# Patient Record
Sex: Male | Born: 1980 | Race: White | Hispanic: Yes | Marital: Single | Smoking: Never smoker
Health system: Southern US, Community
[De-identification: ages and names within clinical notes are randomized; demographics above are authoritative.]

## PROBLEM LIST (undated history)

## (undated) HISTORY — PX: APPENDECTOMY: SHX54

## (undated) HISTORY — PX: BACK SURGERY: SHX140

---

## 2020-10-12 ENCOUNTER — Ambulatory Visit
Admission: EM | Admit: 2020-10-12 | Discharge: 2020-10-12 | Disposition: A | Discharge status: Home / Self Care | Payer: HRSA Program | Attending: Family Medicine | Admitting: Family Medicine

## 2020-10-12 ENCOUNTER — Other Ambulatory Visit: Payer: Self-pay

## 2020-10-12 DIAGNOSIS — U071 COVID-19: Secondary | ICD-10-CM | POA: Diagnosis not present

## 2020-10-12 MED ORDER — KETOROLAC TROMETHAMINE 10 MG PO TABS: 10.0000 mg | ORAL_TABLET | Freq: Four times a day (QID) | ORAL | 0 refills | Status: AC | PRN

## 2020-10-12 MED ORDER — BENZONATATE 200 MG PO CAPS: 200.0000 mg | ORAL_CAPSULE | Freq: Three times a day (TID) | ORAL | 0 refills | Status: AC | PRN

## 2020-10-12 NOTE — Discharge Instructions (Signed)
Test result will be back tomorrow.  Stay home.   Medication as prescribed.  Take care  Dr. Adriana Simas

## 2020-10-12 NOTE — ED Triage Notes (Signed)
Patient complains of headache, cough and body aches, diarrhea and chills since yesterday. Patient has been exposed to covid.

## 2020-10-13 LAB — SARS CORONAVIRUS 2 (TAT 6-24 HRS): SARS Coronavirus 2: POSITIVE — AB

## 2020-10-13 NOTE — ED Provider Notes (Signed)
[facility]    CSN: [id number] Arrival date & time: 10/12/20  1240      History   Chief Complaint Chief Complaint  Patient presents with  . Headache    HPI Furqan Gosselin is a 40 y.o. male.   HPI  40 year old male presents with headache, cough, diarrhea, chills.  Symptoms started yesterday. Has had recent COVID exposure. Pain is diffuse and 8/10 in severity. No relieving factors. No current fever. No other complaints at this time.   Home Medications    Prior to Admission medications   Medication Sig Start Date End Date Taking? Authorizing Provider  benzonatate (TESSALON) 200 MG capsule Take 1 capsule (200 mg total) by mouth 3 (three) times daily as needed for cough. 10/12/20  Yes Jadine Brumley G, DO  ketorolac (TORADOL) 10 MG tablet Take 1 tablet (10 mg total) by mouth every 6 (six) hours as needed for moderate pain or severe pain. 10/12/20  Yes Tommie Sams, DO    Family History Family History  Problem Relation Age of Onset  . Healthy Mother   . Healthy Father     Social History Social History   Tobacco Use  . Smoking status: Never Smoker  . Smokeless tobacco: Never Used  Vaping Use  . Vaping Use: Never used  Substance Use Topics  . Alcohol use: Yes    Comment: occasionally  . Drug use: Never     Allergies   Patient has no known allergies.   Review of Systems Review of Systems Per HPI  Physical Exam Triage Vital Signs ED Triage Vitals  Enc Vitals Group     BP 10/12/20 1641 (!) 138/92     Pulse Rate 10/12/20 1641 82     Resp 10/12/20 1641 18     Temp 10/12/20 1641 98.2 F (36.8 C)     Temp Source 10/12/20 1641 Oral     SpO2 10/12/20 1641 100 %     Weight 10/12/20 1637 180 lb (81.6 kg)     Height --      Head Circumference --      Peak Flow --      Pain Score 10/12/20 1637 8     Pain Loc --      Pain Edu? --      Excl. in GC? --    Updated Vital Signs BP (!) 138/92 (BP Location: Left Arm)   Pulse 82   Temp 98.2 F (36.8 C)  (Oral)   Resp 18   Wt 81.6 kg   SpO2 100%   Visual Acuity Right Eye Distance:   Left Eye Distance:   Bilateral Distance:    Right Eye Near:   Left Eye Near:    Bilateral Near:     Physical Exam Vitals and nursing note reviewed.  Constitutional:      General: He is not in acute distress.    Appearance: He is well-developed. He is not ill-appearing.  HENT:     Head: Normocephalic and atraumatic.  Cardiovascular:     Rate and Rhythm: Normal rate and regular rhythm.  Pulmonary:     Effort: Pulmonary effort is normal.     Breath sounds: Normal breath sounds. No wheezing, rhonchi or rales.  Neurological:     Mental Status: He is alert.  Psychiatric:        Mood and Affect: Mood normal.        Behavior: Behavior normal.     UC Treatments /  Results  Labs (all labs ordered are listed, but only abnormal results are displayed) Labs Reviewed  SARS CORONAVIRUS 2 (TAT 6-24 HRS) - Abnormal; Notable for the following components:      Result Value   SARS Coronavirus 2 POSITIVE (*)    All other components within normal limits    EKG   Radiology No results found.  Procedures Procedures (including critical care time)  Medications Ordered in UC Medications - No data to display  Initial Impression / Assessment and Plan / UC Course  I have reviewed the triage vital signs and the nursing notes.  Pertinent labs & imaging results that were available during my care of the patient were reviewed by me and considered in my medical decision making (see chart for details).    40 year old male presents with COVID. Treating with toradol and tessalon perles.   Final Clinical Impressions(s) / UC Diagnoses   Final diagnoses:  COVID     Discharge Instructions     Test result will be back tomorrow.  Stay home.   Medication as prescribed.  Take care  Dr. Adriana Simas    ED Prescriptions    Medication Sig Dispense Auth. Provider   ketorolac (TORADOL) 10 MG tablet Take 1 tablet  (10 mg total) by mouth every 6 (six) hours as needed for moderate pain or severe pain. 20 tablet Jamir Rone G, DO   benzonatate (TESSALON) 200 MG capsule Take 1 capsule (200 mg total) by mouth 3 (three) times daily as needed for cough. 30 capsule Tommie Sams, DO     PDMP not reviewed this encounter.   Tommie Sams, [place] 10/13/20 2313

## 2022-03-05 ENCOUNTER — Emergency Department: Payer: Self-pay

## 2022-03-05 ENCOUNTER — Other Ambulatory Visit: Payer: Self-pay

## 2022-03-05 ENCOUNTER — Encounter: Payer: Self-pay | Admitting: Radiology

## 2022-03-05 ENCOUNTER — Emergency Department
Admission: EM | Admit: 2022-03-05 | Discharge: 2022-03-05 | Disposition: A | Discharge status: Home / Self Care | Payer: Self-pay | Attending: Emergency Medicine | Admitting: Emergency Medicine

## 2022-03-05 DIAGNOSIS — I10 Essential (primary) hypertension: Secondary | ICD-10-CM | POA: Insufficient documentation

## 2022-03-05 DIAGNOSIS — M6281 Muscle weakness (generalized): Secondary | ICD-10-CM | POA: Insufficient documentation

## 2022-03-05 DIAGNOSIS — R200 Anesthesia of skin: Secondary | ICD-10-CM | POA: Insufficient documentation

## 2022-03-05 LAB — CBC WITH DIFFERENTIAL/PLATELET
Abs Immature Granulocytes: 0.01 10*3/uL (ref 0.00–0.07)
Basophils Absolute: 0 10*3/uL (ref 0.0–0.1)
Basophils Relative: 1 %
Eosinophils Absolute: 0.1 10*3/uL (ref 0.0–0.5)
Eosinophils Relative: 2 %
HCT: 47.8 % (ref 39.0–52.0)
Hemoglobin: 16.4 g/dL (ref 13.0–17.0)
Immature Granulocytes: 0 %
Lymphocytes Relative: 31 %
Lymphs Abs: 2.2 10*3/uL (ref 0.7–4.0)
MCH: 30.1 pg (ref 26.0–34.0)
MCHC: 34.3 g/dL (ref 30.0–36.0)
MCV: 87.7 fL (ref 80.0–100.0)
Monocytes Absolute: 0.3 10*3/uL (ref 0.1–1.0)
Monocytes Relative: 5 %
Neutro Abs: 4.5 10*3/uL (ref 1.7–7.7)
Neutrophils Relative %: 61 %
Platelets: 160 10*3/uL (ref 150–400)
RBC: 5.45 MIL/uL (ref 4.22–5.81)
RDW: 11.9 % (ref 11.5–15.5)
WBC: 7.2 10*3/uL (ref 4.0–10.5)
nRBC: 0 % (ref 0.0–0.2)

## 2022-03-05 LAB — COMPREHENSIVE METABOLIC PANEL
ALT: 29 U/L (ref 0–44)
AST: 30 U/L (ref 15–41)
Albumin: 5.4 g/dL — ABNORMAL HIGH (ref 3.5–5.0)
Alkaline Phosphatase: 81 U/L (ref 38–126)
Anion gap: 12 (ref 5–15)
BUN: 10 mg/dL (ref 6–20)
CO2: 24 mmol/L (ref 22–32)
Calcium: 10.1 mg/dL (ref 8.9–10.3)
Chloride: 106 mmol/L (ref 98–111)
Creatinine, Ser: 0.95 mg/dL (ref 0.61–1.24)
GFR, Estimated: >60 mL/min (ref >60)
Glucose, Bld: 97 mg/dL (ref 70–99)
Potassium: 3.3 mmol/L — ABNORMAL LOW (ref 3.5–5.1)
Sodium: 142 mmol/L (ref 135–145)
Total Bilirubin: 1.1 mg/dL (ref 0.3–1.2)
Total Protein: 8.2 g/dL — ABNORMAL HIGH (ref 6.5–8.1)

## 2022-03-05 LAB — URINALYSIS, ROUTINE W REFLEX MICROSCOPIC
Bilirubin Urine: NEGATIVE
Glucose, UA: NEGATIVE mg/dL
Hgb urine dipstick: NEGATIVE
Ketones, ur: NEGATIVE mg/dL
Leukocytes,Ua: NEGATIVE
Nitrite: NEGATIVE
Protein, ur: NEGATIVE mg/dL
Specific Gravity, Urine: 1.006 (ref 1.005–1.030)
pH: 7 (ref 5.0–8.0)

## 2022-03-05 LAB — PROTIME-INR
INR: 1 (ref 0.8–1.2)
Prothrombin Time: 13.2 seconds (ref 11.4–15.2)

## 2022-03-05 IMAGING — MR MR HEAD WO/W CM
14 series · 48 of 48 positions shown · IV contrast (gadavist)
Comparison: No prior MRI of the head or cervical spine, correlation
is made with CT head [DATE]

CLINICAL DATA: Left-sided numbness, stroke or cervical
radiculopathy suspected

EXAM:
MRI HEAD WITHOUT AND WITH CONTRAST
MRI CERVICAL SPINE WITHOUT AND WITH CONTRAST
TECHNIQUE: Multiplanar, multiecho pulse sequences of the brain and surrounding
structures, and cervical spine, to include the craniocervical
junction and cervicothoracic junction, were obtained without and
with intravenous contrast.
CONTRAST:  8mL GADAVIST GADOBUTROL 1 MMOL/ML IV SOLN

[Series 5: ax dwi_tracew · axial · 3.0mm · 0.65mm/px · z∈[-112,+43]mm · 3 of 48 slices shown]
[im 1/48]
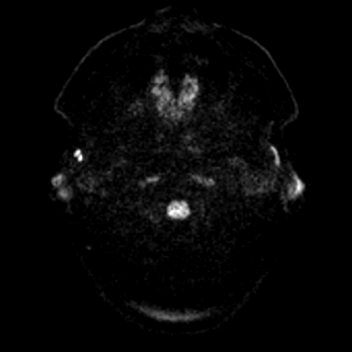
[im 24/48]
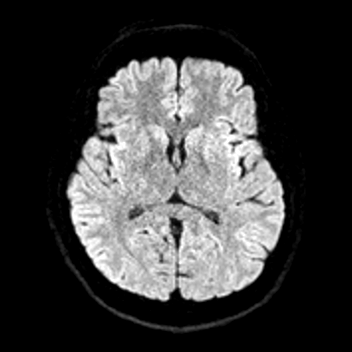
[im 48/48]
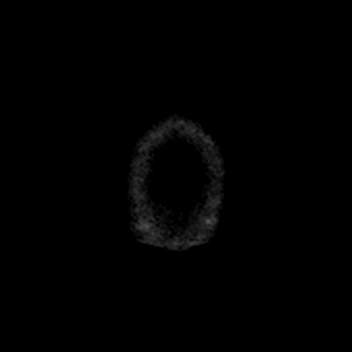

[Series 6: ax dwi_adc · axial · 3.0mm · 0.65mm/px · z∈[-112,+43]mm · 3 of 48 slices shown]
[im 1/48]
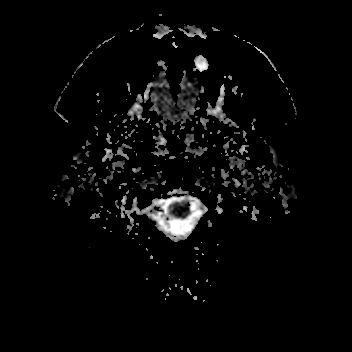
[im 24/48]
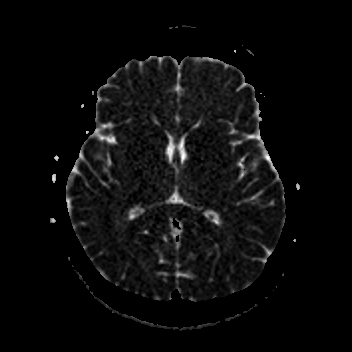
[im 48/48]
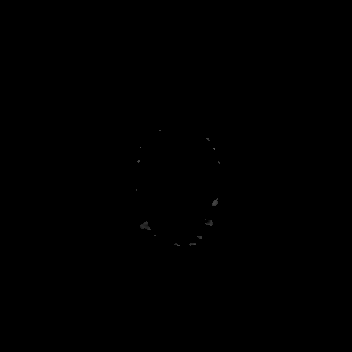

[Series 7: cor dwi_tracew · coronal · 5.0mm · 0.60mm/px · 2 of 34 slices shown]
[im 1/34]
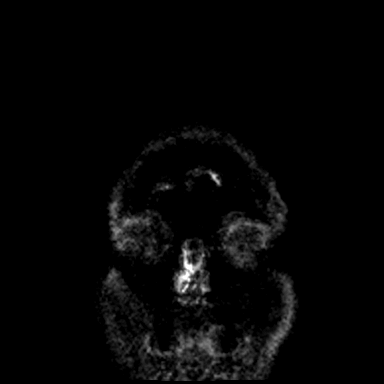
[im 34/34]
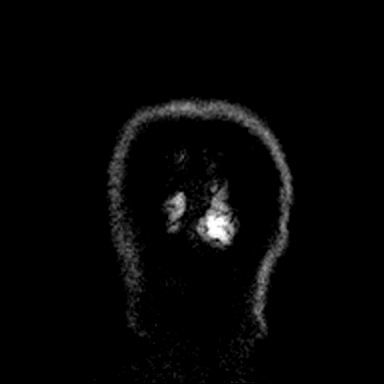

[Series 8: cor dwi_adc · coronal · 5.0mm · 0.60mm/px · 2 of 34 slices shown]
[im 1/34]
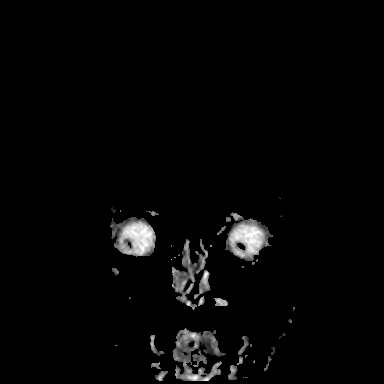
[im 34/34]
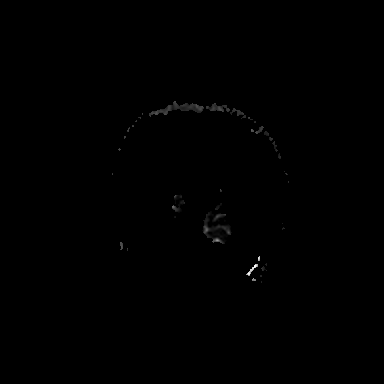

[Series 9: T1 · sagittal · 5.0mm · 0.62mm/px · 1 of 22 slices shown (1 of 2)]
[im 1/22]
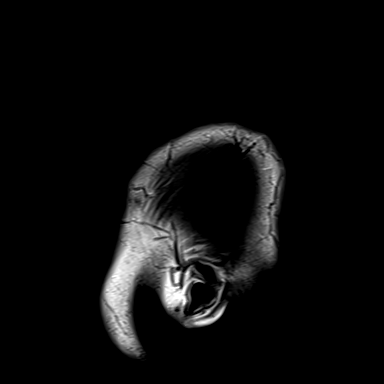

[Series 10: T2 · axial · 5.0mm · 0.53mm/px · 1 of 26 slices shown]
[im 1/26]
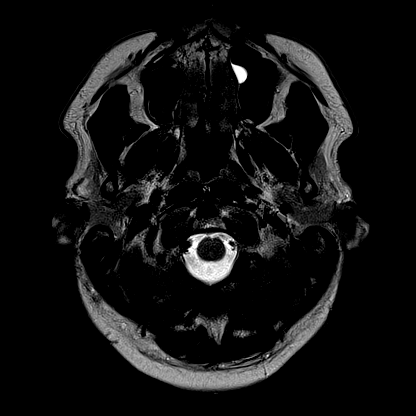

[Series 11: ax swi_mag · axial · 2.0mm · 0.90mm/px · z∈[-113,+44]mm · 5 of 80 slices shown]
[im 1/80]
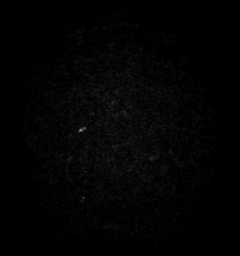
[im 20/80]
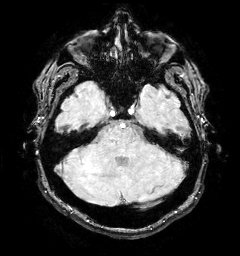
[im 40/80]
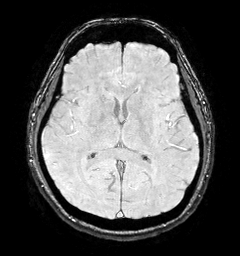
[im 60/80]
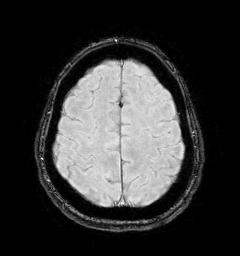
[im 80/80]
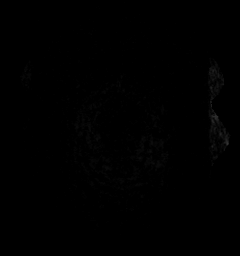

[Series 12: ax swi_pha · axial · 2.0mm · 0.90mm/px · z∈[-113,+44]mm · 5 of 80 slices shown]
[im 1/80]
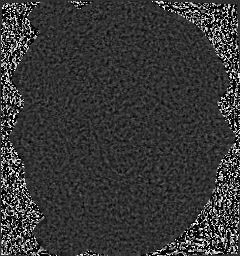
[im 20/80]
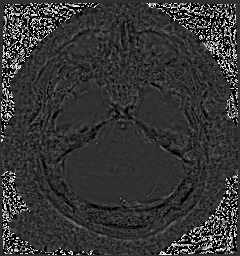
[im 40/80]
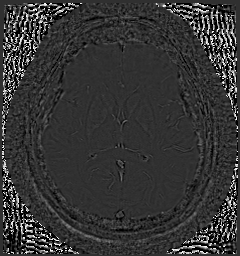
[im 60/80]
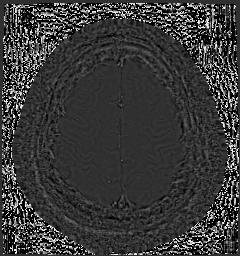
[im 80/80]
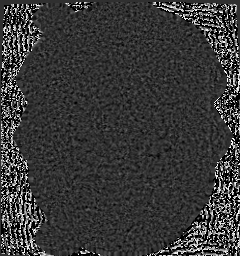

[Series 13: ax swi_swi · axial · 2.0mm · 0.90mm/px · z∈[-113,+44]mm · 5 of 80 slices shown]
[im 1/80]
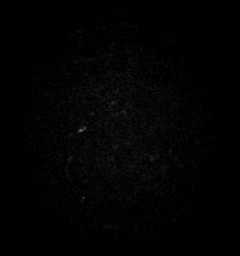
[im 20/80]
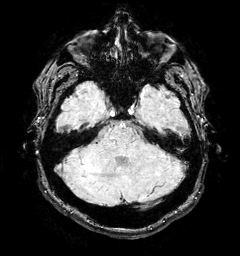
[im 40/80]
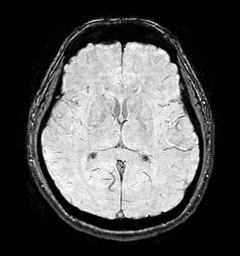
[im 60/80]
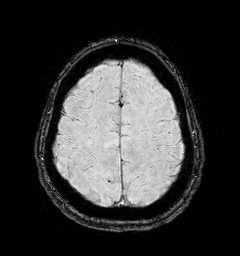
[im 80/80]
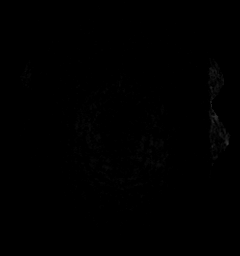

[Series 15: FLAIR · axial · 3.0mm · 0.53mm/px · z∈[-104,+36]mm · 3 of 48 slices shown]
[im 1/48]
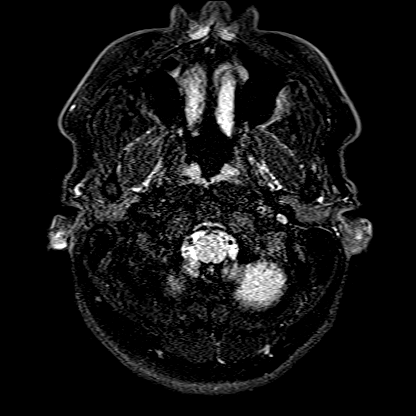
[im 24/48]
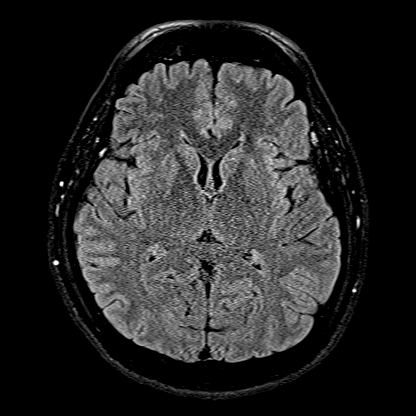
[im 48/48]
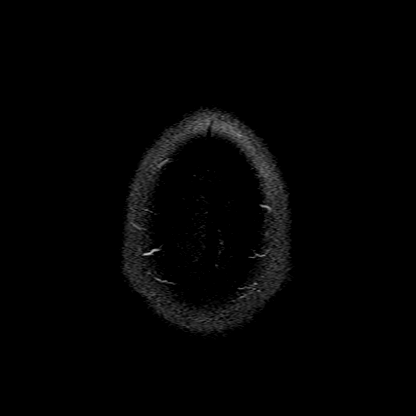

[Series 16: T1 · axial · 1.0mm · 0.98mm/px · z∈[-106,+36]mm · 8 of 144 slices shown (2 of 2)]
[im 1/144]
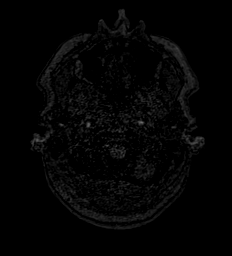
[im 21/144]
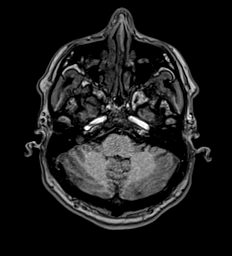
[im 41/144]
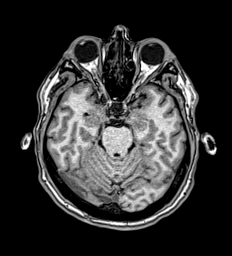
[im 62/144]
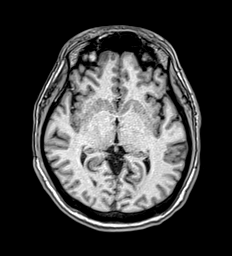
[im 82/144]
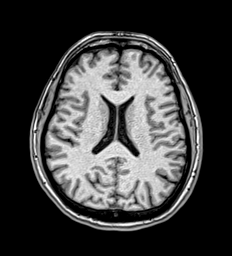
[im 103/144]
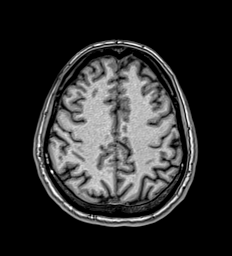
[im 123/144]
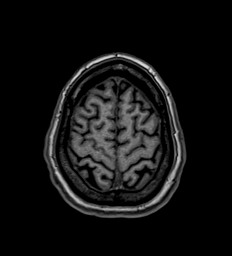
[im 144/144]
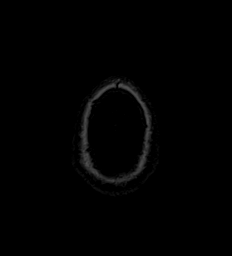

[Series 17: T2 post-contrast · coronal · 5.0mm · 0.57mm/px · 1 of 26 slices shown]
[im 1/26]
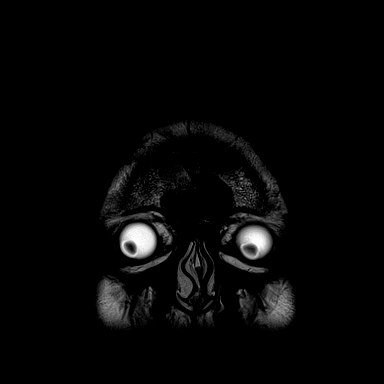

[Series 18: T1 post-contrast · axial · 1.0mm · 0.98mm/px · z∈[-106,+36]mm · 8 of 144 slices shown (1 of 2)]
[im 1/144]
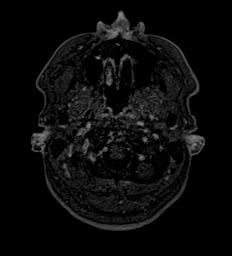
[im 21/144]
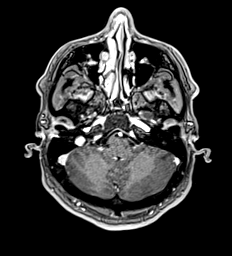
[im 41/144]
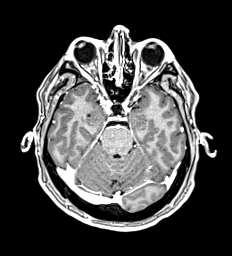
[im 62/144]
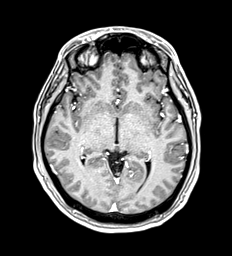
[im 82/144]
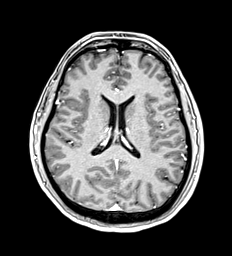
[im 103/144]
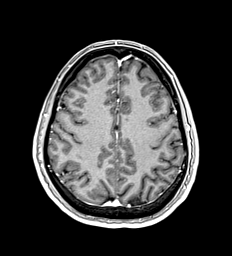
[im 123/144]
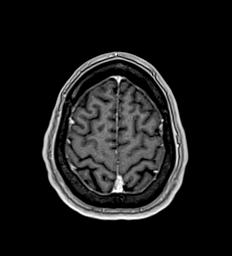
[im 144/144]
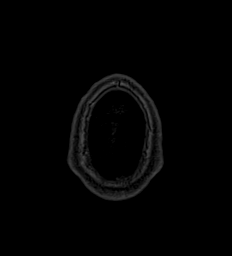

[Series 19: T1 post-contrast · coronal · 5.0mm · 0.57mm/px · 1 of 26 slices shown (2 of 2)]
[im 1/26]
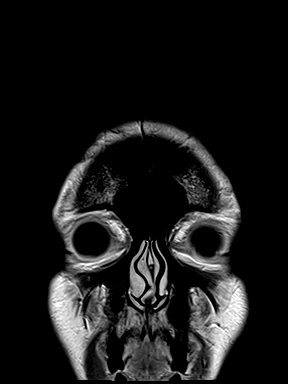

[48 of 48 positions shown; findings below may reference images not displayed]

FINDINGS: MRI HEAD FINDINGS

Brain: No restricted diffusion to suggest acute or subacute infarct.
No acute hemorrhage, mass, mass effect, or midline shift. No
hemosiderin deposition to suggest remote hemorrhage. No abnormal T2
hyperintense signal in the periventricular white matter. No abnormal
parenchymal or meningeal enhancement. No hydrocephalus or
extra-axial collection.

Vascular: Normal arterial flow voids. Normal vascular enhancement.
The venous sinuses are patent on postcontrast imaging.

Skull and upper cervical spine: Normal marrow signal.

Sinuses/Orbits: Mucous retention cyst in the left maxillary sinus.
The orbits are unremarkable.

Other: The mastoids are well aerated.

MRI CERVICAL SPINE FINDINGS

Alignment: Physiologic.

Vertebrae: No acute fracture or suspicious osseous lesion. No
abnormal osseous enhancement.

Cord: Normal in signal and morphology.  No abnormal enhancement.

Posterior Fossa, vertebral arteries, paraspinal tissues: Negative.

Disc levels:

C2-C3: No significant disc bulge. No spinal canal stenosis or neural
foraminal narrowing.

C3-C4: No significant disc bulge. No spinal canal stenosis or neural
foraminal narrowing.

C4-C5: No significant disc bulge. No spinal canal stenosis or neural
foraminal narrowing.

C5-C6: Minimal disc bulge. No spinal canal stenosis or neural
foraminal narrowing.

C6-C7: No significant disc bulge. No spinal canal stenosis or
neuroforaminal narrowing.

C7-T1: No significant disc bulge. No spinal canal stenosis or
neuroforaminal narrowing.
IMPRESSION: 1. No acute intracranial process. No evidence of acute or subacute
infarct. No abnormal enhancement.
2. No spinal canal stenosis or neural foraminal narrowing in the
cervical spine. Normal spinal cord signal. No abnormal enhancement.

## 2022-03-05 IMAGING — MR MR CERVICAL SPINE WO/W CM
5 of 8 series · 28 of 48 positions shown · IV contrast (8ml Gadavist)
Comparison: No prior MRI of the head or cervical spine, correlation
is made with CT head [DATE]

CLINICAL DATA: Left-sided numbness, stroke or cervical
radiculopathy suspected

EXAM:
MRI HEAD WITHOUT AND WITH CONTRAST
MRI CERVICAL SPINE WITHOUT AND WITH CONTRAST
TECHNIQUE: Multiplanar, multiecho pulse sequences of the brain and surrounding
structures, and cervical spine, to include the craniocervical
junction and cervicothoracic junction, were obtained without and
with intravenous contrast.
CONTRAST:  8mL GADAVIST GADOBUTROL 1 MMOL/ML IV SOLN

[Series 5: T2 · sagittal · 3.0mm · 0.62mm/px · 4 of 14 slices shown (1 of 2)]
[im 1/14]
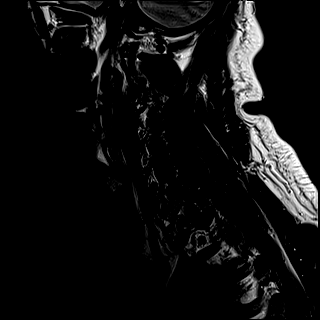
[im 5/14]
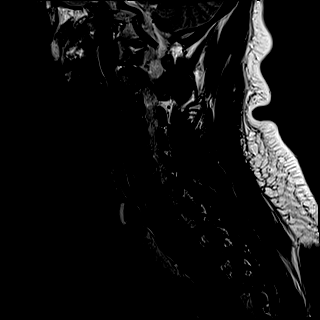
[im 9/14]
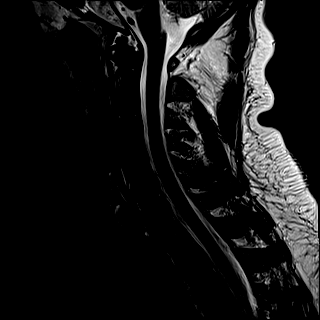
[im 14/14]
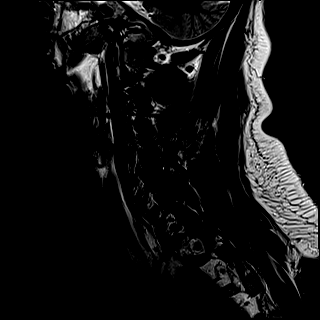

[Series 7: STIR · sagittal · 3.0mm · 0.62mm/px · 4 of 14 slices shown]
[im 1/14]
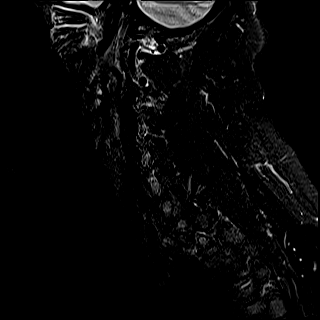
[im 5/14]
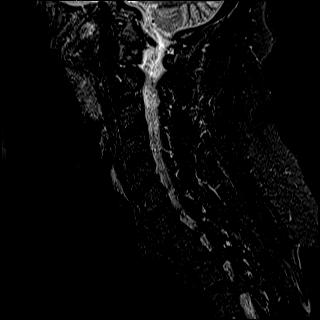
[im 9/14]
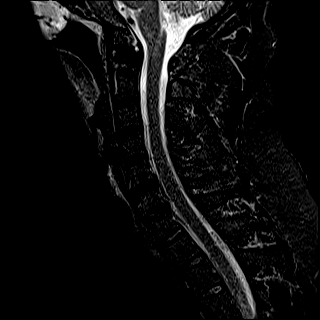
[im 14/14]
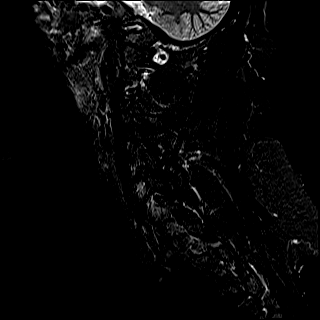

[Series 8: T2 · axial · 3.0mm · 0.70mm/px · z∈[-251,-155]mm · 8 of 30 slices shown (2 of 2)]
[im 1/30]
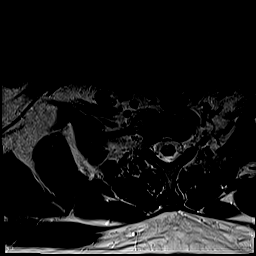
[im 5/30]
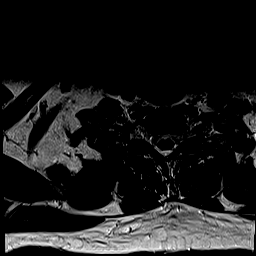
[im 9/30]
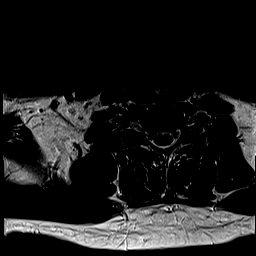
[im 13/30]
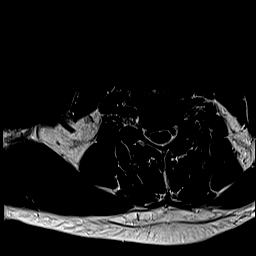
[im 17/30]
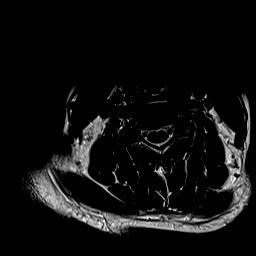
[im 21/30]
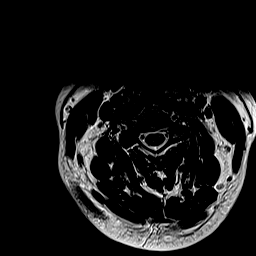
[im 25/30]
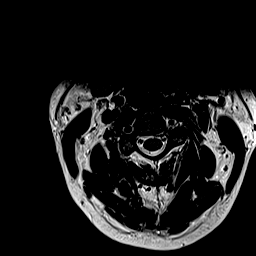
[im 30/30]
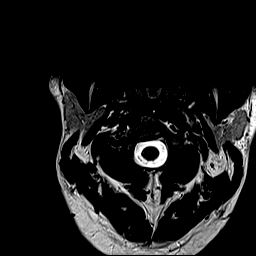

[Series 10: T1 · axial · non-contrast · 3.0mm · 0.35mm/px · z∈[-251,-155]mm · 8 of 30 slices shown]
[im 1/30]
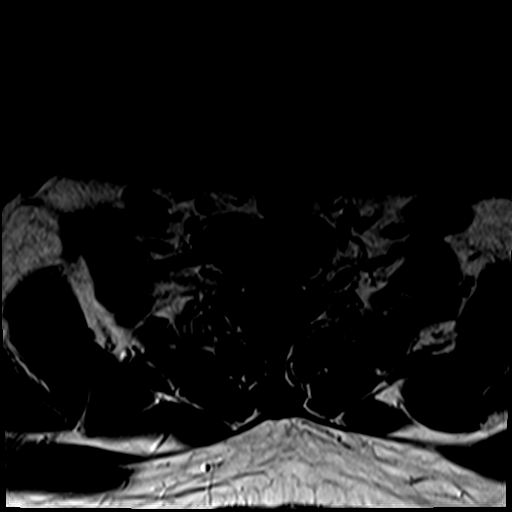
[im 5/30]
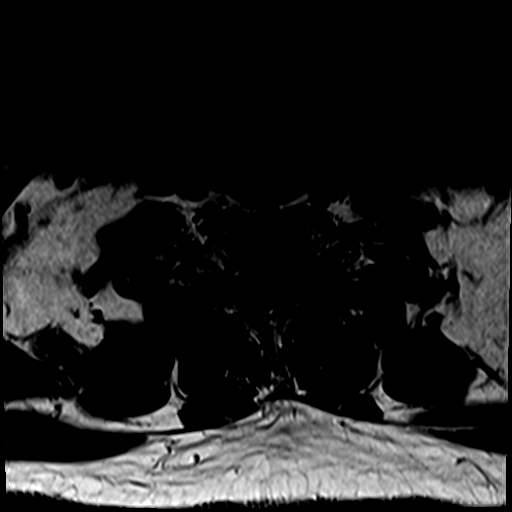
[im 9/30]
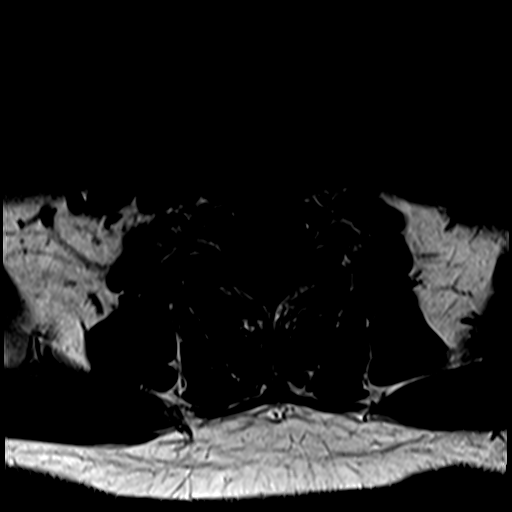
[im 13/30]
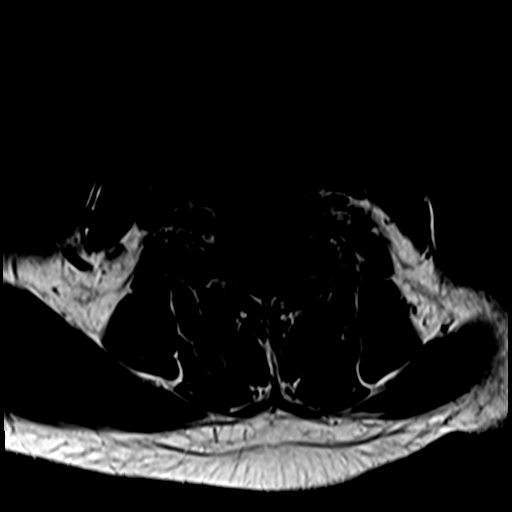
[im 17/30]
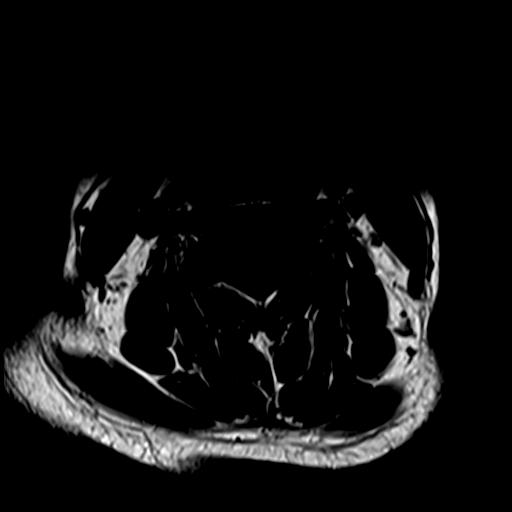
[im 21/30]
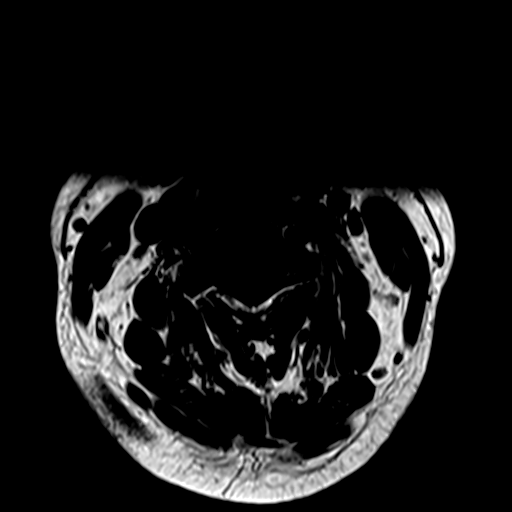
[im 25/30]
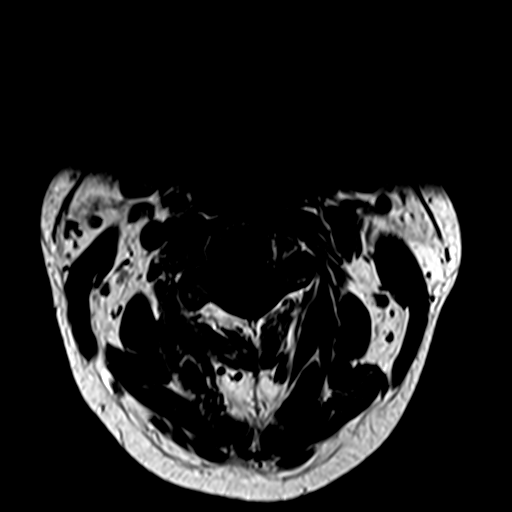
[im 30/30]
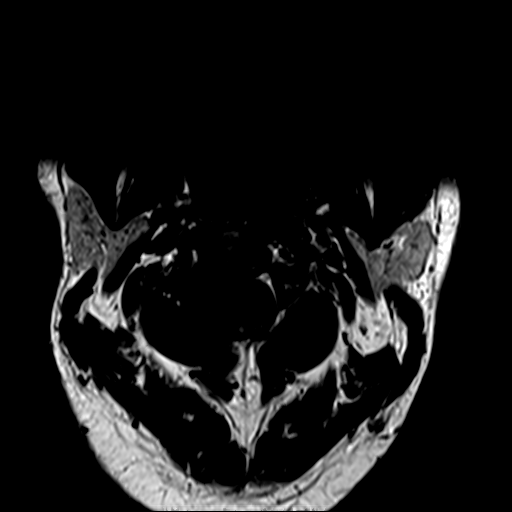

[Series 12: T1 post-contrast · axial · 3.0mm · 0.35mm/px · z∈[-251,-211]mm · 4 of 30 slices shown]
[im 1/30]
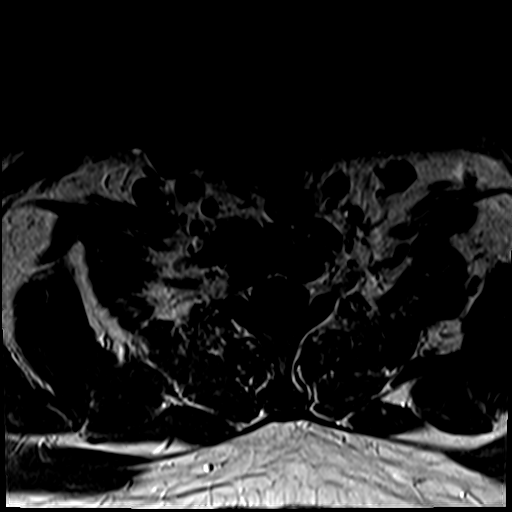
[im 5/30]
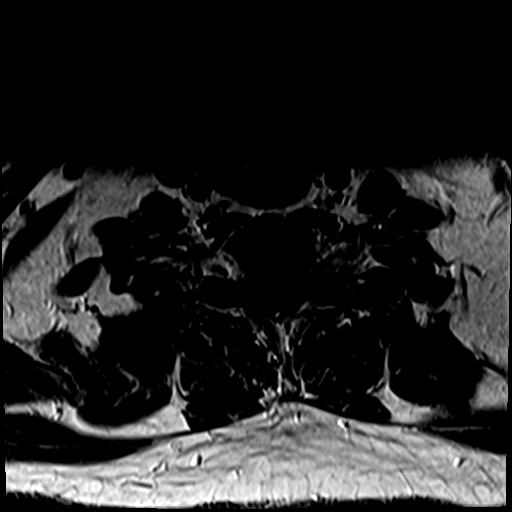
[im 9/30]
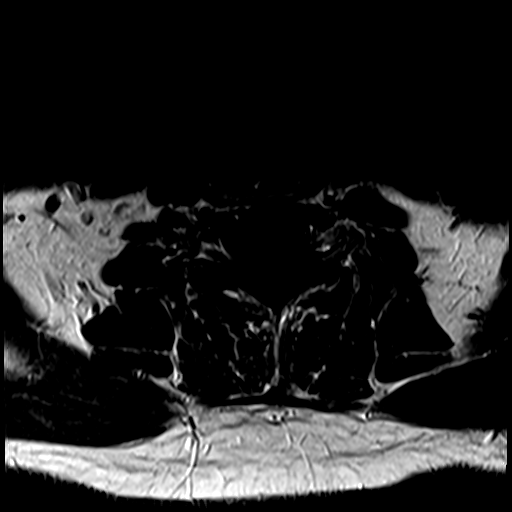
[im 13/30]
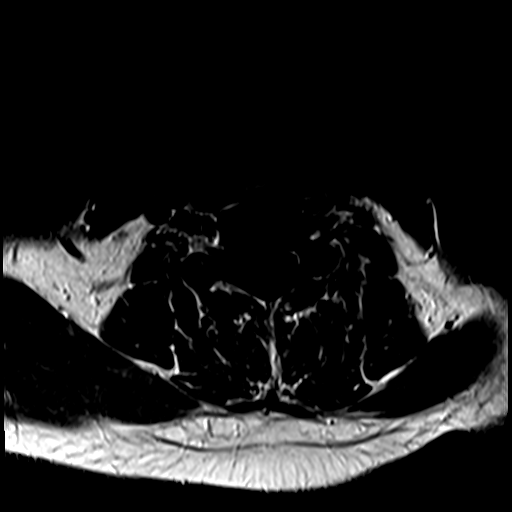

[28 of 48 positions shown; findings below may reference images not displayed]

FINDINGS: MRI HEAD FINDINGS

Brain: No restricted diffusion to suggest acute or subacute infarct.
No acute hemorrhage, mass, mass effect, or midline shift. No
hemosiderin deposition to suggest remote hemorrhage. No abnormal T2
hyperintense signal in the periventricular white matter. No abnormal
parenchymal or meningeal enhancement. No hydrocephalus or
extra-axial collection.

Vascular: Normal arterial flow voids. Normal vascular enhancement.
The venous sinuses are patent on postcontrast imaging.

Skull and upper cervical spine: Normal marrow signal.

Sinuses/Orbits: Mucous retention cyst in the left maxillary sinus.
The orbits are unremarkable.

Other: The mastoids are well aerated.

MRI CERVICAL SPINE FINDINGS

Alignment: Physiologic.

Vertebrae: No acute fracture or suspicious osseous lesion. No
abnormal osseous enhancement.

Cord: Normal in signal and morphology.  No abnormal enhancement.

Posterior Fossa, vertebral arteries, paraspinal tissues: Negative.

Disc levels:

C2-C3: No significant disc bulge. No spinal canal stenosis or neural
foraminal narrowing.

C3-C4: No significant disc bulge. No spinal canal stenosis or neural
foraminal narrowing.

C4-C5: No significant disc bulge. No spinal canal stenosis or neural
foraminal narrowing.

C5-C6: Minimal disc bulge. No spinal canal stenosis or neural
foraminal narrowing.

C6-C7: No significant disc bulge. No spinal canal stenosis or
neuroforaminal narrowing.

C7-T1: No significant disc bulge. No spinal canal stenosis or
neuroforaminal narrowing.
IMPRESSION: 1. No acute intracranial process. No evidence of acute or subacute
infarct. No abnormal enhancement.
2. No spinal canal stenosis or neural foraminal narrowing in the
cervical spine. Normal spinal cord signal. No abnormal enhancement.

## 2022-03-05 IMAGING — CT CT HEAD W/O CM
4 series · 16 of 47 positions shown, 18 images · non-contrast
Comparison: None Available.

CLINICAL DATA: Left upper extremity numbness, left lower extremity
numbness



[Series 2: head wo · axial · 0.44mm/px · z∈[-124,+11]mm · 7 of 37 slices shown, 9 images]
[im 5/37  brain]
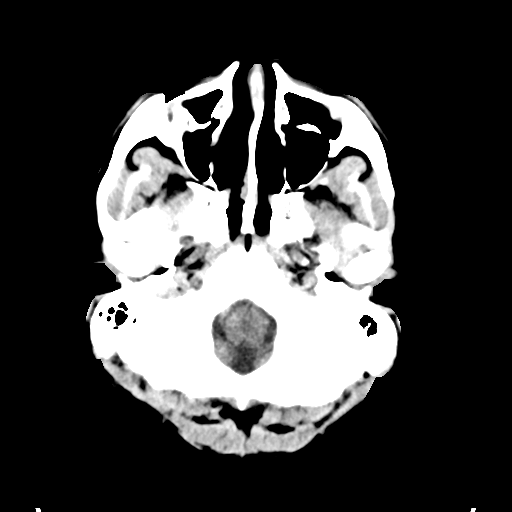
[im 5/37  bone]
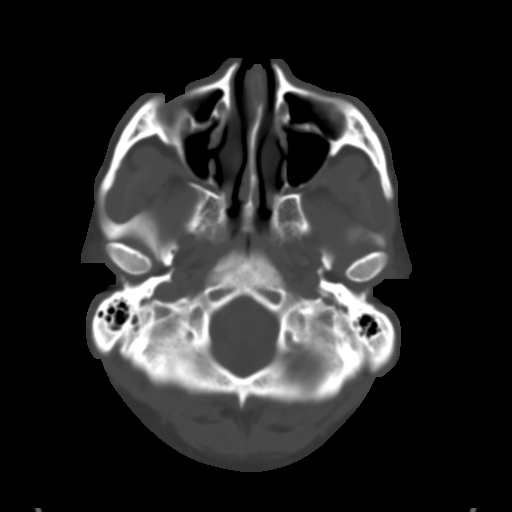
[im 10/37  brain]
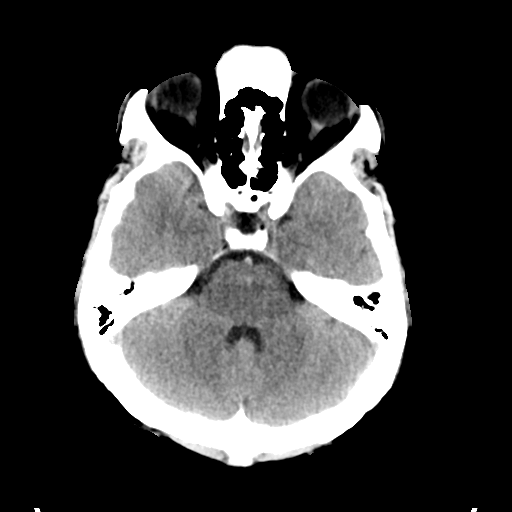
[im 14/37  brain]
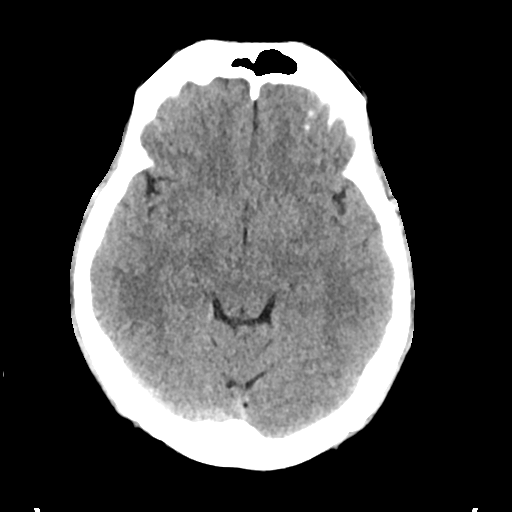
[im 19/37  brain]
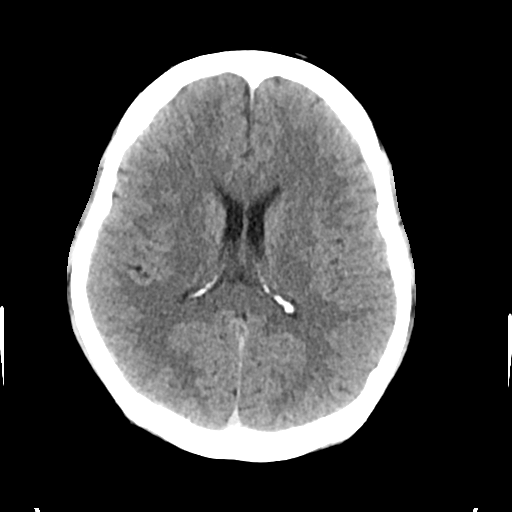
[im 23/37  brain]
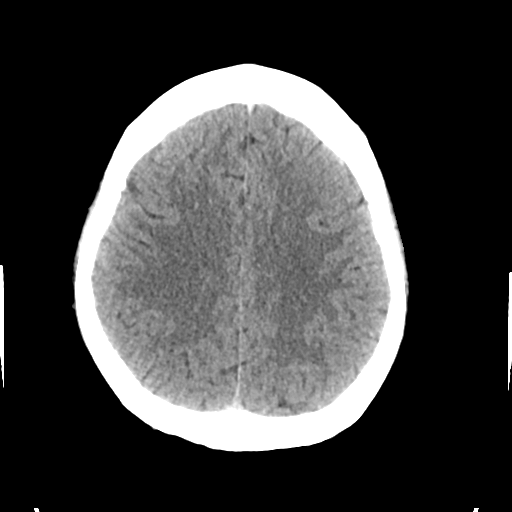
[im 23/37  bone]
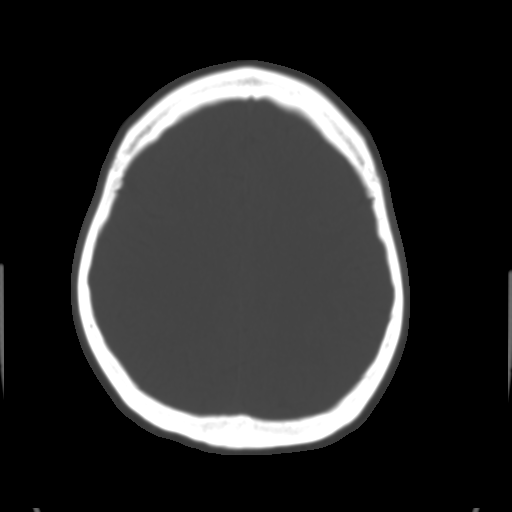
[im 28/37  brain]
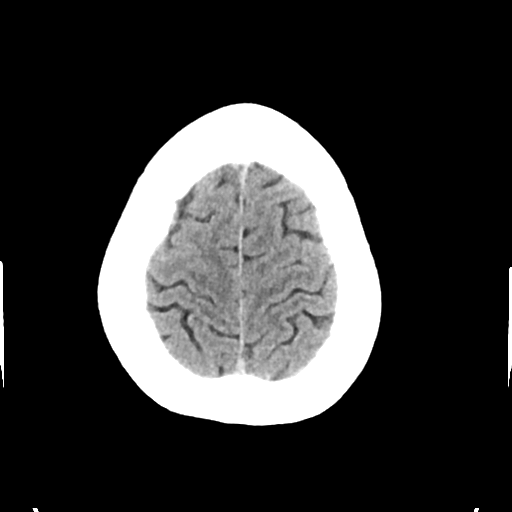
[im 32/37  brain]
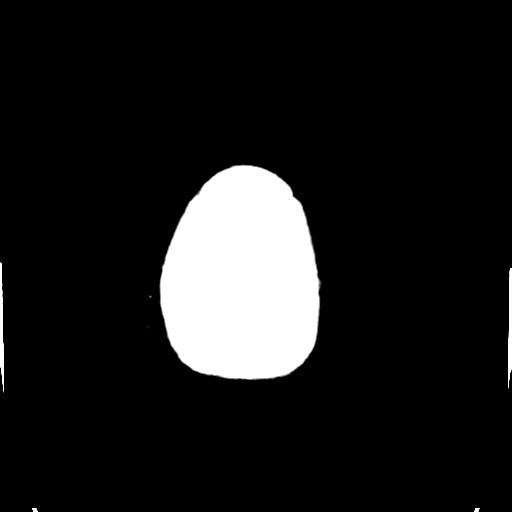

[Series 3: head bone · axial · 0.44mm/px · z∈[-126,-90]mm · 3 of 93 slices shown]
[im 10/93  bone]
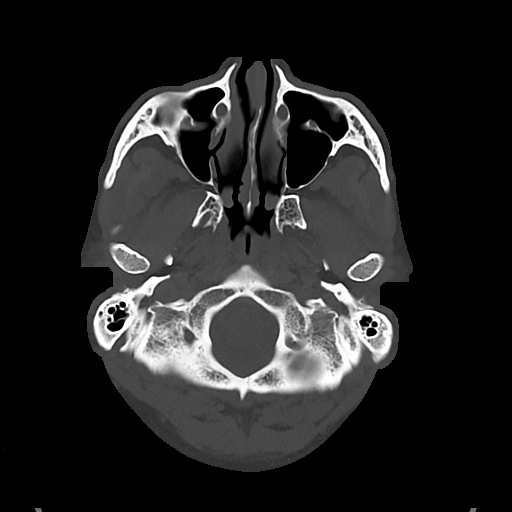
[im 19/93  bone]
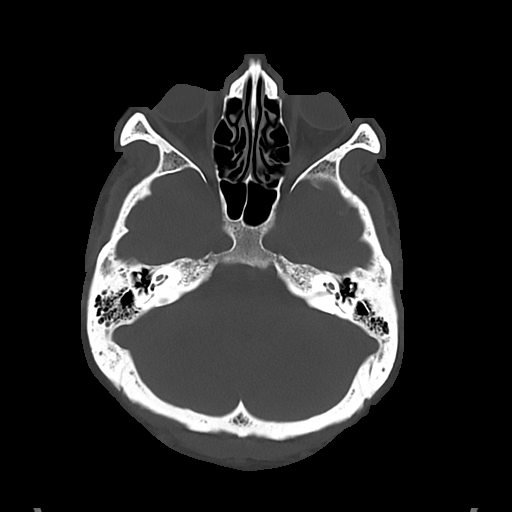
[im 28/93  bone]
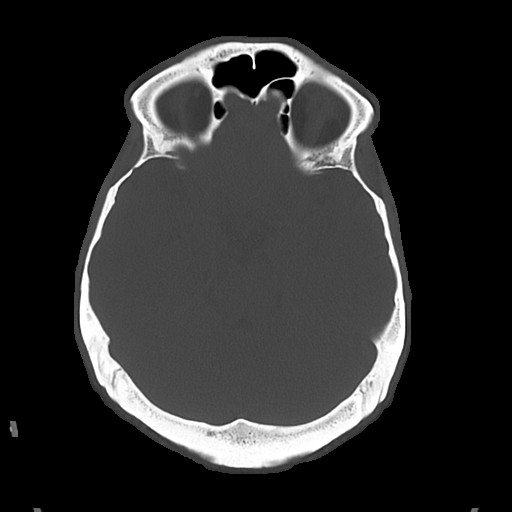

[Series 4: cor soft · coronal · 0.35mm/px · 3 of 73 slices shown]
[im 25/73  brain]
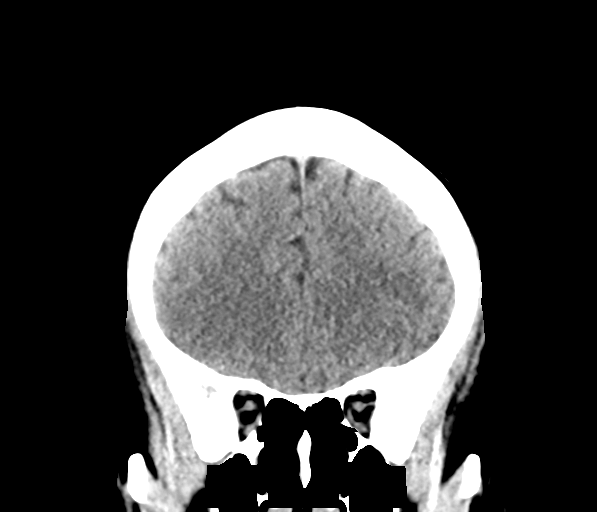
[im 33/73  brain]
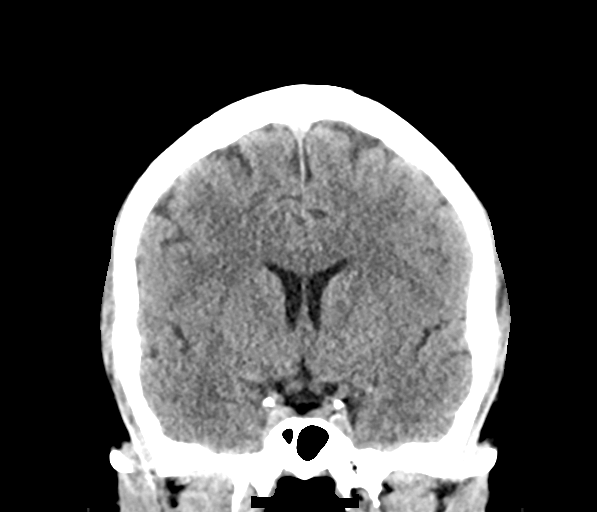
[im 41/73  brain]
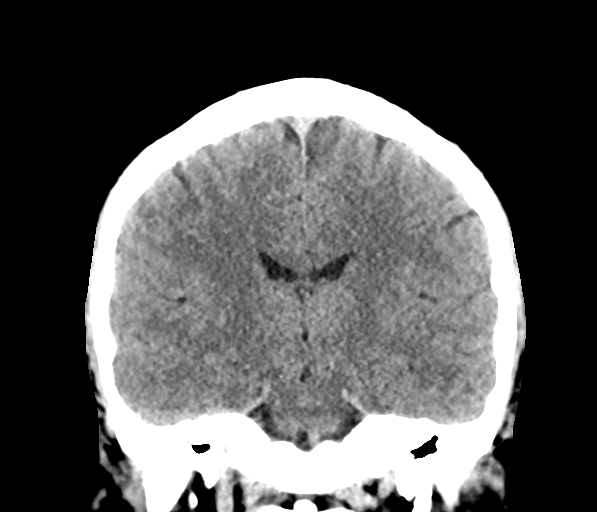

[Series 5: sag soft · sagittal · 0.39mm/px · 3 of 64 slices shown]
[im 22/64  brain]
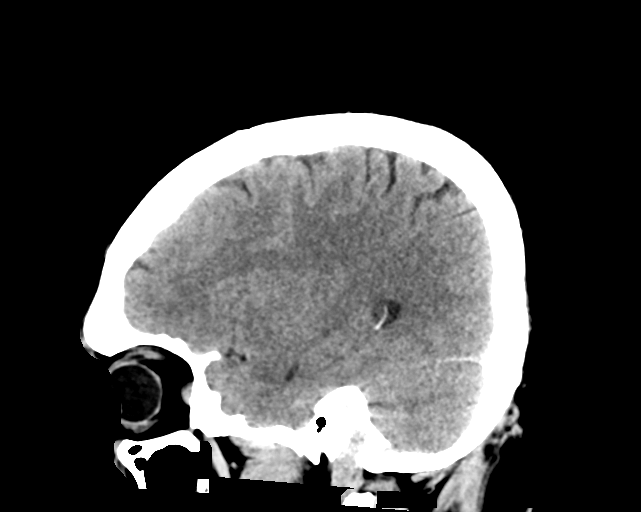
[im 32/64  brain]
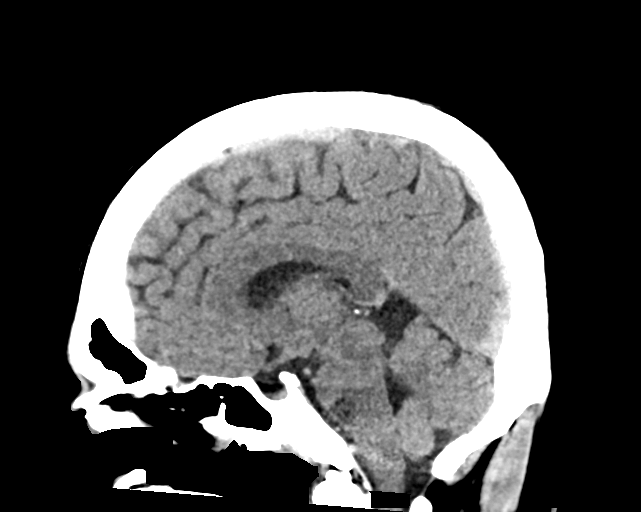
[im 43/64  brain]
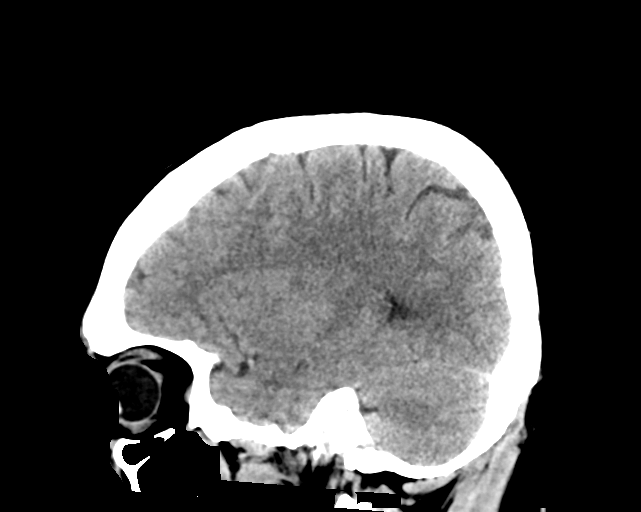

[16 of 47 positions shown; findings below may reference images not displayed]

FINDINGS: Brain: No acute intracranial findings are seen. Ventricles are not
dilated. There is no shift of midline structures. There are no signs
of bleeding within the cranium. There is no focal mass effect.

Vascular: Unremarkable.

Skull: Unremarkable.

Sinuses/Orbits: There is mild mucosal thickening in the left
maxillary sinus.

Other: None
IMPRESSION: No acute intracranial findings are seen in the noncontrast CT brain.

Mild chronic sinusitis.

## 2022-03-05 MED ORDER — GADOBUTROL 1 MMOL/ML IV SOLN
8.0000 mL | Freq: Once | INTRAVENOUS | Status: AC | PRN
  Administered 2022-03-05: 8 mL via INTRAVENOUS

## 2022-03-05 MED ORDER — PREDNISONE 20 MG PO TABS: 40.0000 mg | ORAL_TABLET | Freq: Every day | ORAL | 0 refills | Status: AC

## 2022-03-05 NOTE — ED Notes (Signed)
Pt returned from MRI, alert and oriented, speaking in full sentences.

## 2022-03-05 NOTE — Discharge Instructions (Signed)
Seguimiento con Youth worker. Regrese a la sala de emergencias por entumecimiento nuevo, persistente o que empeora, cualquier debilidad, dificultad para caminar o mantener el equilibrio, cambios en la visin, dificultad para hablar o comprender, o cualquier otro sntoma nuevo o que empeora que le preocupe.

## 2022-03-05 NOTE — ED Triage Notes (Addendum)
Pt presents to ED with c/o of L arm numbness on Wednesday and states L leg numbness Thursday and then endorses today his whole L side is numb today. Pt denies any issues with asaphia or any episodes of confusion. Pt states L arm and leg weakness. Pt has equal grip strength to the upper and lower extremities. Pt reports his sensation to the right of his face is more sensitive han the L.  Pt denies any blood thinners at this time. No injury or trauma to to the head.   No pronator drift noted, no droop and symmetrical smile at this time.

## 2022-03-05 NOTE — ED Notes (Signed)
Pt alert and oriented, speaking in full sentences, breathing easy and unlabored, family at the bedside, will continue to monitor.

## 2022-03-05 NOTE — ED Provider Triage Note (Signed)
Emergency Medicine Provider Triage Evaluation Note  Gordon [name], a 41 y.o. male  was evaluated in triage.  Pt complains of with a history of lipidemia, sent by his PCP for r/o stroke. Patient noted LUE numbness on Wednesday and by Thursday began to experience LLE numbness. By today, he notes what he describes as complete left-sided numbness including the face. He describes his left face as more sensitive than the left.  He denies any facial droop, slurred speech, paralysis, or aphasia. He denies recent injury or illness. He denies HTN history  Review of Systems  Positive: Left-sided sensation changes Negative: Paralysis, slurred speech  Physical Exam  BP (!) 151/114 (BP Location: Right Arm)   Pulse 67   Temp 98.2 F (36.8 C) (Oral)   Resp 17   SpO2 100%  Gen:   Awake, no distress  NAD Resp:  Normal effort CTA MSK:   Moves extremities without difficulty  Neuro:  CN II-XII grossly intact  Medical Decision Making  Medically screening exam initiated at 5:53 PM.  Appropriate orders placed.  Trevell Jody [name] was informed that the remainder of the evaluation will be completed by another provider, this initial triage assessment does not replace that evaluation, and the importance of remaining in the ED until their evaluation is complete.  Patient to the ED at the advice of his primary provider, for evaluation of left-sided paresthesias with symptoms since Wednesday.  Patient presents in no acute distress with some reported sensation changes to the left arm and leg as well as the face.  Denies any aphasia, paralysis, generalized weakness.   Gordon Hoard, PA-C 03/05/22 1801

## 2022-03-05 NOTE — ED Provider Notes (Signed)
[facility] Provider Note    Event Date/Time   First MD Initiated Contact with [name] 03/05/22 1805     (approximate)   History   Numbness (Pt sent by PMD for evaluation of left sided numbness and weakness.)  HPI obtained via in-person Spanish interpreter  HPI  Gordon [name] is a 41 y.o. male with history of high cholesterol who presents with left sided numbness over Gordon last 2 days.  Gordon [name] initially had numbness in Gordon left arm 2 days ago which has remained constant.  Yesterday he then started to develop numbness in Gordon leg and today he feels like Gordon whole left side of his body is numb including Gordon left side of his face.  He states that his left leg feels slightly weak when compared to normal, but he has been able to walk normally.  He denies any changes in his vision, speech or understanding, or coordination.  He has no headache.  He denies any prior history of these symptoms.    Physical Exam   Triage Vital Signs: ED Triage Vitals  Enc Vitals Group     BP 03/05/22 1740 (!) 151/114     Pulse Rate 03/05/22 1740 67     Resp 03/05/22 1740 17     Temp 03/05/22 1740 98.2 F (36.8 C)     Temp Source 03/05/22 1740 Oral     SpO2 03/05/22 1740 100 %     Weight --      Height --      Head Circumference --      Peak Flow --      Pain Score 03/05/22 1800 0     Pain Loc --      Pain Edu? --      Excl. in GC? --     Most recent vital signs: Vitals:   03/05/22 2000 03/05/22 2200  BP: 123/87 133/72  Pulse: (!) 51 (!) 50  Resp:    Temp:    SpO2: 100% 100%    General: Alert, comfortable appearing CV:  Good peripheral perfusion.  Resp:  Normal effort.  Abd:  No distention.  Other:  EOMI.  PERRLA.  No photophobia.  No facial droop.  Cranial nerves III through XII grossly intact.  5/5 motor strength to bilateral upper and lower extremities.  Decreased sensation to left face, arm, and leg.  No ataxia.  No pronator drift.  Normal  gait.   ED Results / Procedures / Treatments   Labs (all labs ordered are listed, but only abnormal results are displayed) Labs Reviewed  COMPREHENSIVE METABOLIC PANEL - Abnormal; Notable for Gordon following components:      Result Value   Potassium 3.3 (*)    Total Protein 8.2 (*)    Albumin 5.4 (*)    All other components within normal limits  URINALYSIS, ROUTINE W REFLEX MICROSCOPIC - Abnormal; Notable for Gordon following components:   Color, Urine STRAW (*)    APPearance CLEAR (*)    All other components within normal limits  CBC WITH DIFFERENTIAL/PLATELET  PROTIME-INR     EKG  ED ECG REPORT I, Dionne Bucy, Gordon attending physician, personally viewed and interpreted this ECG.  Date: 03/05/2022 EKG Time: 1802 Rate: 67 Rhythm: normal sinus rhythm (incorrectly read by machine as undetermined rhythm) QRS Axis: normal Intervals: normal ST/T Wave abnormalities: normal Narrative Interpretation: no evidence of acute ischemia    RADIOLOGY  CT head: I independently viewed and interpreted Gordon  images; there is no ICH or evidence of acute stroke  MR brain/cervical spine:    IMPRESSION:  1. No acute intracranial process. No evidence of acute or subacute  infarct. No abnormal enhancement.  2. No spinal canal stenosis or neural foraminal narrowing in Gordon  cervical spine. Normal spinal cord signal. No abnormal enhancement.     PROCEDURES:  Critical Care performed: No  Procedures   MEDICATIONS ORDERED IN ED: Medications  gadobutrol (GADAVIST) 1 MMOL/ML injection 8 mL (8 mLs Intravenous Contrast Given 03/05/22 2117)     IMPRESSION / MDM / ASSESSMENT AND PLAN / ED COURSE  I reviewed Gordon triage vital signs and Gordon nursing notes.  41 year old male with history of high cholesterol presents with left-sided numbness over Gordon last 2 days with possible mild left leg weakness but no other significant neurologic symptoms.  He has no prior history of this.  On review of  Gordon chart, Gordon [name] has no relevant past medical records other than a visit to my been urgent care in 2022 for viral symptoms.  On exam Gordon [name] is well-appearing.  His vital signs are normal except for hypertension.  Neurologic exam is normal except for left face, upper extremity, and lower extremity decreased sensation.  There is no weakness elicited on my exam.  Differential diagnosis includes, but is not limited to, CVA, cervical radiculopathy, complex migraine.  [name]'s presentation is most consistent with acute presentation with potential threat to life or bodily function.  CT head shows no acute findings.  I consulted Dr. Selina Cooley from neurology.  She recommends MRI of Gordon brain and cervical spine and if these are negative Gordon [name] would be able to go home.  ----------------------------------------- 10:43 PM on 03/05/2022 -----------------------------------------  MRI of Gordon brain and cervical spine are both negative for acute findings.  Gordon [name] has no additional symptoms.  I discussed Gordon results of Gordon work-up with Gordon [name] via Gordon Spanish video interpreter.  At this time, Gordon [name] is stable for discharge home.  I answered all of his questions and gave him thorough return precautions; he expressed understanding.  I provided neurology referral.  Although Gordon MRI of Gordon cervical spine is negative, overall I suspect most likely radiculopathy, neuropraxia, or other inflammatory process, or possibly migraine.  I have prescribed a short course of prednisone.   FINAL CLINICAL IMPRESSION(S) / ED DIAGNOSES   Final diagnoses:  Left sided numbness     Rx / DC Orders   ED Discharge Orders          Ordered    predniSONE (DELTASONE) 20 MG tablet  Daily with breakfast        03/05/22 2243             Note:  This document was prepared using Dragon voice recognition software and may include unintentional dictation errors.    Dionne Bucy, MD 03/05/22  2245

## 2022-03-05 NOTE — ED Notes (Signed)
Pt in MRI.

## 2023-07-28 ENCOUNTER — Ambulatory Visit (LOCAL_COMMUNITY_HEALTH_CENTER): Payer: Self-pay

## 2023-07-28 DIAGNOSIS — Z719 Counseling, unspecified: Secondary | ICD-10-CM

## 2023-07-28 DIAGNOSIS — Z23 Encounter for immunization: Secondary | ICD-10-CM

## 2023-07-28 NOTE — Progress Notes (Signed)
Client seen in nurse clinic for vaccines needed for Immigration. Interpretation provided by Osborn Coho. Patient paid for Polio vaccine.   Client qualified per [place] for Tdap and COVID-19.  Vaccines administered and tolerated well.   Client reported he had to get to work and did not stay for the 15 minutes of post vaccine observation.  Client with no questions.  Gordon Burtch Sherrilyn Rist, RN

## 2023-08-10 ENCOUNTER — Ambulatory Visit (LOCAL_COMMUNITY_HEALTH_CENTER): Payer: Self-pay

## 2023-08-10 DIAGNOSIS — Z719 Counseling, unspecified: Secondary | ICD-10-CM

## 2023-08-10 DIAGNOSIS — Z23 Encounter for immunization: Secondary | ICD-10-CM

## 2023-08-10 NOTE — Progress Notes (Signed)
Client seen in nurse clinic for  vaccine needed for immigration.  Interpretation provided by Marlene Yemen.   Client reports he just needs Hep B.  Vaccine administered and tolerated well.  Charde Macfarlane Sherrilyn Rist, RN
